# Patient Record
Sex: Female | Born: 1995 | Race: Black or African American | Hispanic: No | Marital: Single
Health system: Southern US, Community
[De-identification: ages and names within clinical notes are randomized; demographics above are authoritative.]

---

## 2014-03-10 ENCOUNTER — Emergency Department (HOSPITAL_COMMUNITY)
Admission: EM | Admit: 2014-03-10 | Discharge: 2014-03-10 | Disposition: A | Discharge status: Home / Self Care | Payer: Self-pay | Attending: Emergency Medicine | Admitting: Emergency Medicine

## 2014-03-10 ENCOUNTER — Emergency Department (HOSPITAL_COMMUNITY): Payer: Self-pay

## 2014-03-10 ENCOUNTER — Encounter (HOSPITAL_COMMUNITY): Payer: Self-pay | Admitting: Emergency Medicine

## 2014-03-10 DIAGNOSIS — Z23 Encounter for immunization: Secondary | ICD-10-CM | POA: Insufficient documentation

## 2014-03-10 DIAGNOSIS — R4182 Altered mental status, unspecified: Secondary | ICD-10-CM | POA: Insufficient documentation

## 2014-03-10 DIAGNOSIS — Y9389 Activity, other specified: Secondary | ICD-10-CM | POA: Insufficient documentation

## 2014-03-10 DIAGNOSIS — W19XXXA Unspecified fall, initial encounter: Secondary | ICD-10-CM

## 2014-03-10 DIAGNOSIS — Y9289 Other specified places as the place of occurrence of the external cause: Secondary | ICD-10-CM | POA: Insufficient documentation

## 2014-03-10 DIAGNOSIS — W1839XA Other fall on same level, initial encounter: Secondary | ICD-10-CM | POA: Insufficient documentation

## 2014-03-10 DIAGNOSIS — S80212A Abrasion, left knee, initial encounter: Secondary | ICD-10-CM | POA: Insufficient documentation

## 2014-03-10 LAB — ETHANOL: ALCOHOL ETHYL (B): 237 mg/dL — AB (ref 0–11)

## 2014-03-10 MED ORDER — ONDANSETRON HCL 4 MG/2ML IJ SOLN
4.0000 mg | Freq: Once | INTRAMUSCULAR | Status: AC
  Administered 2014-03-10: 4 mg via INTRAVENOUS
  Filled 2014-03-10: qty 2

## 2014-03-10 MED ORDER — AMMONIA AROMATIC IN INHA
RESPIRATORY_TRACT | Status: AC
  Filled 2014-03-10: qty 10

## 2014-03-10 MED ORDER — TETANUS-DIPHTH-ACELL PERTUSSIS 5-2.5-18.5 LF-MCG/0.5 IM SUSP
0.5000 mL | Freq: Once | INTRAMUSCULAR | Status: AC
  Administered 2014-03-10: 0.5 mL via INTRAMUSCULAR
  Filled 2014-03-10: qty 0.5

## 2014-03-10 NOTE — ED Notes (Signed)
She is awake, alert and in no distress.  She ambulates without difficulty to our lobby.

## 2014-03-10 NOTE — ED Provider Notes (Signed)
CSN: [id number]     Arrival date & time 03/10/14  [id number] History   First MD Initiated Contact with Patient 03/10/14 0440     Chief Complaint  Patient presents with  . Alcohol Intoxication  . Emesis     (Consider location/radiation/quality/duration/timing/severity/associated sxs/prior Treatment) HPI Alben SpittleMorgan Katayama is a 18 y.o. female with unknown past medical history coming in with alcohol intoxication. Patient is unable to give her own history due to the intoxication.  Per EMS, patient was found on the ground and the pull of her own vomit. She comes from [place] and is here for homecoming weekend. Patient is arousable for history and able to tell me her name. She merely falls back asleep. She denies being in pain anywhere.   No past medical history on file. No past surgical history on file. History reviewed. No pertinent family history. History  Substance Use Topics  . Smoking status: Not on file  . Smokeless tobacco: Not on file  . Alcohol Use: Yes   OB History   Grav Para Term Preterm Abortions TAB SAB Ect Mult Living                 Review of Systems  Unable to perform ROS: Mental status change      Allergies  Review of patient's allergies indicates no known allergies.  Home Medications   Prior to Admission medications   Not on File   BP 79/51  Pulse 91  Resp 15  SpO2 96% Physical Exam  Nursing note and vitals reviewed. Constitutional: She appears well-developed and well-nourished. No distress.  Drowsy but arousable  HENT:  Head: Normocephalic and atraumatic.  Nose: Nose normal.  Mouth/Throat: Oropharynx is clear and moist. No oropharyngeal exudate.  Eyes: Conjunctivae and EOM are normal. Pupils are equal, round, and reactive to light. No scleral icterus.  Neck: Normal range of motion. Neck supple. No JVD present. No tracheal deviation present. No thyromegaly present.  Cardiovascular: Normal rate, regular rhythm and normal heart sounds.  Exam reveals no  gallop and no friction rub.   No murmur heard. Pulmonary/Chest: Effort normal and breath sounds normal. No respiratory distress. She has no wheezes. She exhibits no tenderness.  Abdominal: Soft. Bowel sounds are normal. She exhibits no distension and no mass. There is no tenderness. There is no rebound and no guarding.  Musculoskeletal: Normal range of motion. She exhibits no edema and no tenderness.  Abrasion to left knee, no laceration, no active hemorrhage.  Lymphadenopathy:    She has no cervical adenopathy.  Neurological:  Patient response to painful stimuli. She is drowsy but easily arousable. She moves all 4 extremities.  Skin: Skin is warm and dry. No rash noted. She is not diaphoretic. No erythema. No pallor.    ED Course  Procedures (including critical care time) Labs Review Labs Reviewed  ETHANOL - Abnormal; Notable for the following:    Alcohol, Ethyl (B) 237 (*)    All other components within normal limits    Imaging Review Dg Tibia/fibula Left Port  03/10/2014   CLINICAL DATA:  Found on the ground. Fall. Abrasion along the anterior left tib-fib.  EXAM: PORTABLE LEFT TIBIA AND FIBULA - 2 VIEW  COMPARISON:  None.  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 No evidence of acute fracture or subluxation. No focal bone lesion or bone destruction. Bone cortex and trabecular architecture appear intact. No radiopaque soft tissue foreign bodies.  IMPRESSION: No acute bony abnormalities.   Electronically Signed  By: Burman NievesWilliam  Stevens M.D.   On: 03/10/2014 05:54     EKG Interpretation None      MDM   Final diagnoses:  Fall    Patient smells of alcohol, and has a nonfocal neuro exam.  I see no signs of external head injury. X-ray of tibia does not reveal any fracture. Patient will be allowed to sober in the emergency department and will be discharged when clinically sober. Patient signed out to Dr. Freida BusmanAllen.  He was advised to DC once clinically sober and can  ambulate without assistance.    Tomasita CrumbleAdeleke Itzae Miralles, MD 03/10/14 [phone]

## 2014-03-10 NOTE — ED Notes (Signed)
Pt noted with low BP 79/51. Resting quietly with eyes closed. Hard to arouse. Ammonia inhalants used and ineffective at this time. IV infused NS at 92799ml/hr without difficulty. Applied cool rag to head/neck area. Pt slightly arousable. Pt place in trendelenburg position with improvement in BP at this time. SR on monitor at 89bpm.

## 2014-03-10 NOTE — ED Provider Notes (Signed)
Patient ambulatory without assistance. Stable for discharge  Paige BakerAnthony T Dulcemaria Bula, MD 03/10/14 1051

## 2014-03-10 NOTE — Discharge Instructions (Signed)
Alcohol Intoxication Paige Knox, you was seen today for alcohol intoxication. You were allowed to sober in the emergency department.  Follow up with a primary care doctor within 3 days for continued treatment.  If your symptoms worsen, come back to the ED for repeat evaluation.  Thank you. Alcohol intoxication occurs when you drink enough alcohol that it affects your ability to function. It can be mild or very severe. Drinking a lot of alcohol in a short time is called binge drinking. This can be very harmful. Drinking alcohol can also be more dangerous if you are taking medicines or other drugs. Some of the effects caused by alcohol may include:  Loss of coordination.  Changes in mood and behavior.  Unclear thinking.  Trouble talking (slurred speech).  Throwing up (vomiting).  Confusion.  Slowed breathing.  Twitching and shaking (seizures).  Loss of consciousness. HOME CARE  Do not drive after drinking alcohol.  Drink enough water and fluids to keep your pee (urine) clear or pale yellow. Avoid caffeine.  Only take medicine as told by your doctor. GET HELP IF:  You throw up (vomit) many times.  You do not feel better after a few days.  You frequently have alcohol intoxication. Your doctor can help decide if you should see a substance use treatment counselor. GET HELP RIGHT AWAY IF:  You become shaky when you stop drinking.  You have twitching and shaking.  You throw up blood. It may look bright red or like coffee grounds.  You notice blood in your poop (bowel movements).  You become lightheaded or pass out (faint). MAKE SURE YOU:   Understand these instructions.  Will watch your condition.  Will get help right away if you are not doing well or get worse. Document Released: 10/20/2007 Document Revised: 01/03/2013 Document Reviewed: 10/06/2012 [facility] Patient Information 2015 [place]. This information is not intended to replace advice given to you by your  health care provider. Make sure you discuss any questions you have with your health care provider.  Emergency Department Resource Guide 1) Find a Doctor and Pay Out of Pocket Although you won't have to find out who is covered by your insurance plan, it is a good idea to ask around and get recommendations. You will then need to call the office and see if the doctor you have chosen will accept you as a new patient and what types of options they offer for patients who are self-pay. Some doctors offer discounts or will set up payment plans for their patients who do not have insurance, but you will need to ask so you aren't surprised when you get to your appointment.  2) Contact Your Local Health Department Not all health departments have doctors that can see patients for sick visits, but many do, so it is worth a call to see if yours does. If you don't know where your local health department is, you can check in your phone book. The CDC also has a tool to help you locate your state's health department, and many state websites also have listings of all of their local health departments.  3) Find a Walk-in Clinic If your illness is not likely to be very severe or complicated, you may want to try a walk in clinic. These are popping up all over the country in pharmacies, drugstores, and shopping centers. They're usually staffed by nurse practitioners or physician assistants that have been trained to treat common illnesses and complaints. They're usually fairly quick and  quick and inexpensive. However, if you have serious medical issues or chronic medical problems, these are probably not your best option. ° °No Primary Care Doctor: °- Call Health Connect at  832-8000 - they can help you locate a primary care doctor that  accepts your insurance, provides certain services, etc. °- Physician Referral Service- 1-800-533-3463 ° °Chronic Pain Problems: °Organization         Address  Phone   Notes  °[facility]   (336) 297-2271 Patients need to be referred by their primary care doctor.  ° °Medication Assistance: °Organization         Address  Phone   Notes  °[place] [facility] [address] °[place], [place] [address] (336) 641-8030 --Must be a resident of [place] °-- Must have NO insurance coverage whatsoever (no Medicaid/ Medicare, etc.) °-- The pt. MUST have a primary care doctor that directs their care regularly and follows them in the community °  °[facility]  (866) 331-1348   °[employer]  (888) 892-1162   ° °Agencies that provide inexpensive medical care: °Organization         Address  Phone   Notes  °[facility]  (336) 832-8035   °[facility]    (336) 832-7272   °[facility] [address] °[place], [place] [address] (336) 832-4777   °[facility] [address], °[place] (336) 271-4999   °[facility]    (336) 373-0678   °[facility]    (336) 272-1050   °[facility] ° [address], [place] Phone:  (336) 832-4444, Fax:  (336) 832-4440 Hours of Operation:  9 am - 6 pm, M-F.  Also accepts Medicaid/Medicare and self-pay.  °[facility] ° [address], [place] Phone: (336) 832-3150, Fax: (336) 832-3151. Hours of Operation:  8:30 am - 5:30 pm, M-F.  Also accepts Medicaid and self-pay.  °[facility] [place] [address], [place] Phone: (336) 878-6027   °[facility] [address], [place], [place] (336)723-1848, Ext. 123 Mondays & Thursdays: 7-9 AM.  First 15 patients are seen on a first come, first serve basis. °  ° °Medicaid-accepting [place] Providers: ° °Organization         Address  Phone   Notes  °[facility] [address], [place] (336) 641-2100 Also accepts self-pay patients.  °[facility] [address],  [place] ° (336) 856-9996   °[facility] [address], [place] (336) 288-8857   °[facility] [address] [place] [address], [place] (336) 299-7000   °Veita Bland [address], [place]  ° (336) 373-1557 Only accepts [place] Access Medicaid patients after they have their name applied to their card.  ° °Self-Pay (no insurance) in [place]: ° °Organization         Address  Phone   Notes  °Sickle Cell Patients, [facility] [address], [place] (336) 832-1970   °[facility] [address], [place] (336) 832-4400   °[facility] ° [address], [place] (336) 992-4800   °[facility]/Dr. Osei-Bonsu ° [address] [place] [address],  or [address], [place] (336) 841-8500 Phone number   for both [place] and [place] locations is the same.  °[facility] [address], [place] (336) 299-0000   °[facility] [address] [place] [address], [place] or [address] (336) 852-7530 °(336) 878-2260   °[facility] [address], [place] (336) 350-1642, phone; (336) 294-5005, fax Sees patients 1st and 3rd Saturday of every month.  Must not qualify for public or private insurance (i.e. Medicaid, Medicare, [place] Health Choice, Veterans' Benefits) • Household income should be no more than 200% of the poverty level •The clinic cannot treat you if you are pregnant or think you are pregnant • Sexually transmitted diseases are not treated at the clinic.  ° ° °Dental Care: °Organization         Address  Phone  Notes  °[facility] [address], [place] (336) 641-6152 Accepts children up to age 21 who are enrolled in Medicaid or [place] Health Choice; pregnant women with a Medicaid card; and children who have applied for Medicaid or [place] Health  Choice, but were declined, whose parents can pay a reduced fee at time of service.  °[facility] [place]  [address], [place] (336) 641-7733 Accepts children up to age 21 who are enrolled in Medicaid or [place] Health Choice; pregnant women with a Medicaid card; and children who have applied for Medicaid or [place] Health Choice, but were declined, whose parents can pay a reduced fee at time of service.  °[facility] ° [address], [place] (336) 641-4533 Patients are seen by appointment only. Walk-ins are not accepted. [facility] will see patients 18 years of age and older. °Monday - Tuesday (8am-5pm) °Most Wednesdays (8:30-5pm) °$30 per visit, cash only  °[facility] ° [address], [place] (336) 641-4533 Patients are seen by appointment only. Walk-ins are not accepted. [facility] will see patients 18 years of age and older. °One Wednesday Evening (Monthly: Volunteer Based).  $30 per visit, cash only  °[facility]  (919) 537-3737 for adults; Children under age 4, call Graduate Pediatric Dentistry at (919) [phone]. Children aged 4-14, please call (919) [phone] to request a pediatric application. ° Dental services are provided in all areas of dental care including fillings, crowns and bridges, complete and partial dentures, implants, gum treatment, root canals, and extractions. Preventive care is also provided. Treatment is provided to both adults and children. °Patients are selected via a lottery and there is often a waiting list. °  °[facility] [address], °[place] ° (336) 763-8833 [email or web address] °  °[facility] [address], [place], [place] (336)723-1848, Ext. 123 Second and Fourth Thursday of each month, opens at 6:30 AM; Clinic ends at 9 AM.  Patients are seen on a first-come first-served basis, and a limited number are seen during each  clinic.  ° °[facility] ° [address], [place],  (336) 723-7904   Eligibility Requirements °You must have lived in [place], [place], or [place] counties for at least the last three months. °  You cannot be eligible for state or federal sponsored healthcare insurance, including Veterans Administration, Medicaid, or Medicare. °  You generally cannot be eligible for healthcare insurance through your employer.  °  How to apply: °Eligibility screenings are held every Tuesday and Wednesday afternoon from 1:00 pm until   4:00 pm. You do not need an appointment for the interview!  °[facility] [address], [place], [place] 336-631-2330   °[facility]  336-342-8273   °[facility]  336-703-3100   °[facility]  336-570-6415   ° °Behavioral Health Resources in the Community: °Intensive Outpatient Programs °Organization         Address  Phone  Notes  °[facility] [address], [place], [place] 336-878-6098   °[facility] [address], [place], [place] 336-832-9800   °[facility] [address], [place], [place] ° 336-882-2125   °[facility] [address],  °[place], [place] 1-800-853-5163 or 336-641-4981   °Substance Abuse Resources °Organization         Address  Phone  Notes  °[facility]  336-882-2125   °[facility]  336-784-9470   °[facility]  336-285-9073   °[facility]  336-845-3988   °Residential & Outpatient Substance Abuse Program  1-800-659-3381   °Psychological Services °Organization         Address  Phone  Notes  ° Health  336- 832-9600   °[facility]  336- 378-7881   °[facility] [address], [place] 1-800-853-5163 or 336-641-4981   ° °Mobile Crisis Teams °Organization         Address  Phone  Notes  °[facility], Mobile  Crisis Care Unit  1-877-626-1772   °Assertive °[facility] ° [address] [place], [place] 336-834-9664   °Sharon DeEsch [address] °[place] 336-554-5454   ° °Self-Help/Support Groups °Organization         Address  Phone             Notes  °[facility] [place] - variety of support groups  336- 373-1402 Call for more information  °Narcotics Anonymous (NA), [facility] [address], °[place]  2 meetings at this location  ° °Residential Treatment Programs °Organization         Address  Phone  Notes  °[facility] [address],    °[place]  1-866-801-8205   °[facility] ° [address], [place], [place] 704-293-8524   °[facility] [address], [place] 336-845-3988 Admissions: 8am-3pm M-F  °[facility] [address]    °[place], [place] 336-841-1104   °[facility] [address], [place], [place] 336-379-7146   °[facility] [address]  °[place], [place] 336-285-9073   °[facility] Intensive Outpatient [address], [place], [place] 336-852-3033   °[facility] [address]  °[place], [place] 1-877-615-2722 or 336-784-9470   °[facility] ([facility] [address] [place], [place] 336-227-7417 Accepts Medicaid  °[facility] [address]  °[place] 1-800-659-3381 Substance Abuse/Addiction Treatment  ° °[place] Behavioral Health Resources °Organization         Address  Phone  Notes  °[facility]  (888) 581-9988   °Julie Brannon, PhD [address] [place], [place]   (336) 349-5553 or (336) 951-0000   °[facility]   [address] °[place], [place] (336) 349-4454   °[facility] [address], [place],  (336) 342-8316 Insurance/Medicaid/sponsorship through [facility]  °[facility] [address] [place] [address]    [place], [place] (336) 342-8316 Therapy/tele-psych/case  °[facility] [address]  ° [place], [place] (336) 349-2233    °Dr. Arfeen  (336) 349-4544   °[facility] [place]  [employer] [place] [facility] 1) [address], [place] °2) [address], [place] °3)  [address], [place] (336) 349-3220 °(336) 342-7768 ° °(336) 342-8140   °[place] [facility] (336) 342-1394 or (336) 342-3537 (After Hours)    ° °  °

## 2014-03-10 NOTE — ED Notes (Signed)
Ambulated pt to restroom. Changed pts bed and gown

## 2014-03-10 NOTE — ED Notes (Signed)
Attempted to wake patient to ambulate, patient would open her eyes for a few seconds and fall back asleep again. Will try to ambulate patient when patient is more alert and awake.

## 2014-03-10 NOTE — ED Notes (Signed)
Per physician request, we attempt to rouse pt. To ambulate her; however it is the judgement of this author that pt. Is simply not ready for this.  Blood ETOH drawn and sent to lab.  Her skin is normal, warm and dry and she is breathing normally.  She drowsily informs us she has no requests at this time.

## 2014-03-10 NOTE — ED Notes (Signed)
Attempted to call friend to come transport pt home, left message with Sofie Hartiganiana Dunn to return call

## 2014-03-10 NOTE — ED Notes (Signed)
X-ray at bedside

## 2014-03-10 NOTE — ED Notes (Signed)
Bed: [id number] Expected date:  Expected time:  Means of arrival:  Comments: EMS 18yo ETOH / vomiting

## 2014-03-10 NOTE — ED Notes (Signed)
Pt arrived via EMS,  Found down on ground in vomit,  Pt doesn't reside here, she resides at [address],  Paige Knox [phone]  [facility]

## 2016-06-12 IMAGING — DX DG TIBIA/FIBULA PORT 2V*L*
3 series · 3 of 3 positions shown · non-contrast
Comparison: None.

CLINICAL DATA: Found on the ground. Fall. Abrasion along the
anterior left tib-fib.

EXAM:
PORTABLE LEFT TIBIA AND FIBULA - 2 VIEW

[tibia ap (1 of 2)]
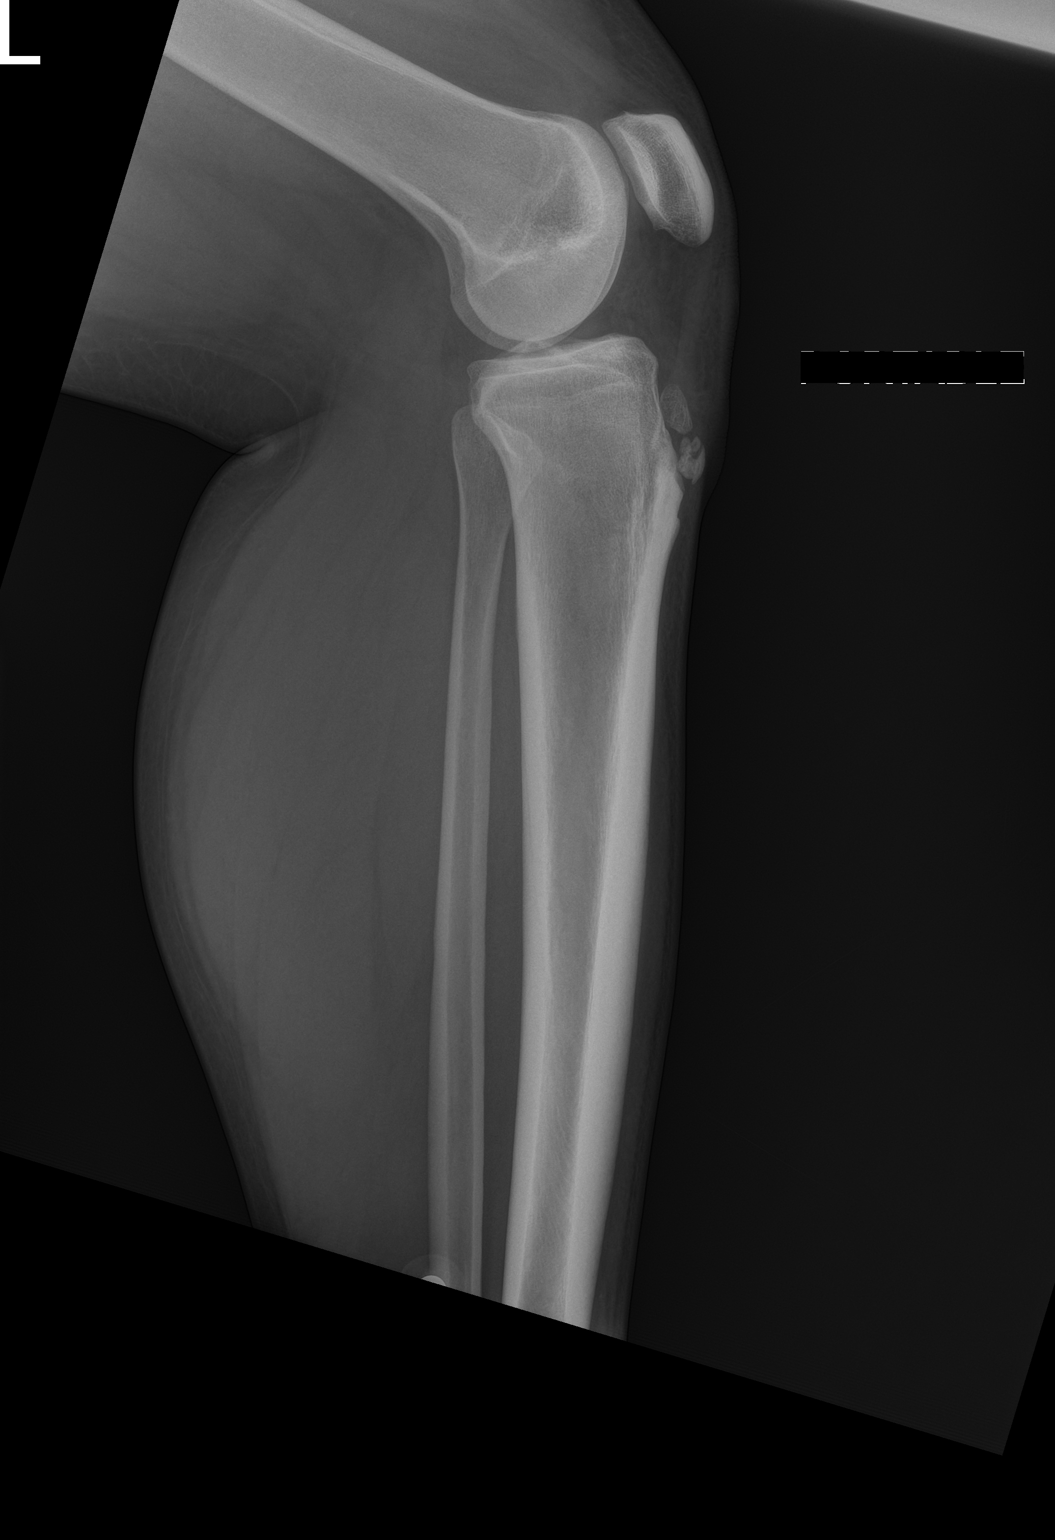

[tibia lat]
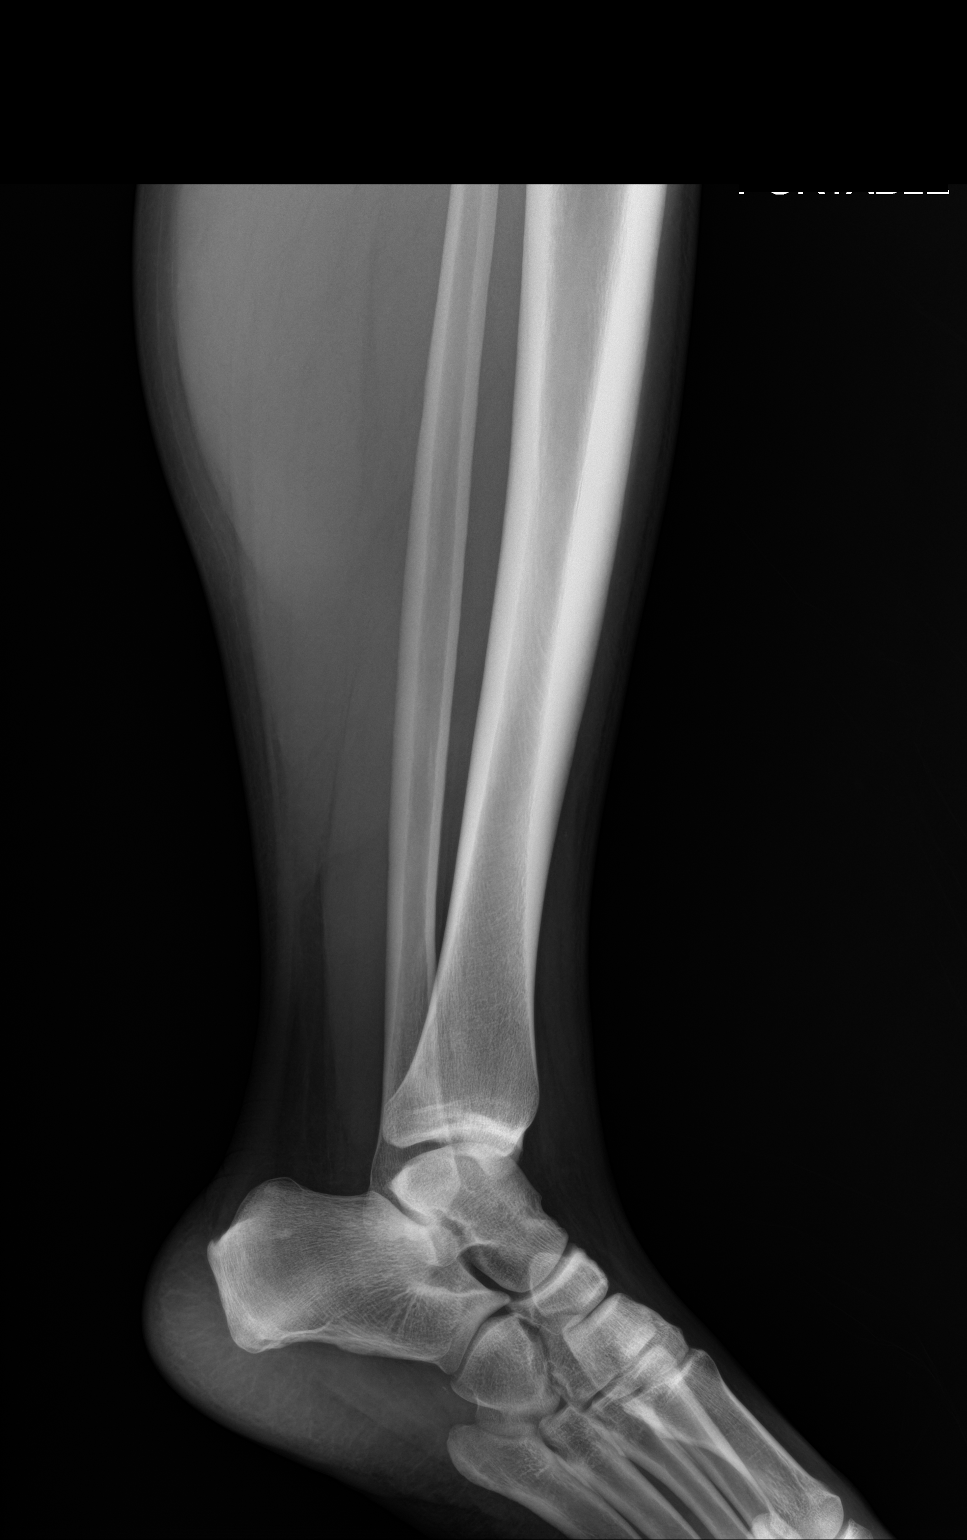

[tibia ap (2 of 2)]
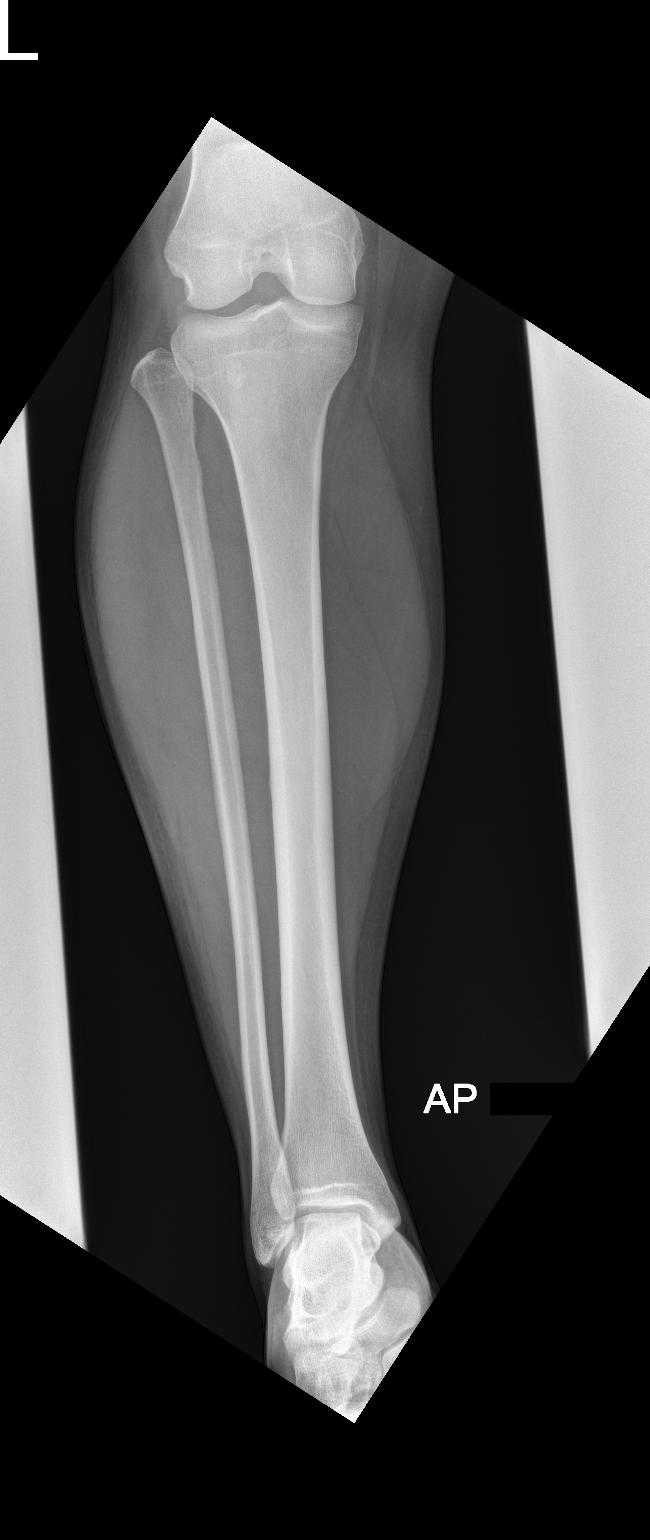

[3 of 3 positions shown; findings below may reference images not displayed]

FINDINGS: Old ununited ossicles over the tibial tubercle. No evidence of acute
fracture or subluxation. No focal bone lesion or bone destruction.
Bone cortex and trabecular architecture appear intact. No radiopaque
soft tissue foreign bodies.
IMPRESSION: No acute bony abnormalities.
